# Patient Record
Sex: Male | Born: 1980 | Race: Black or African American | Hispanic: No | Marital: Married | Smoking: Never smoker
Health system: Southern US, Community
[De-identification: ages and names within clinical notes are randomized; demographics above are authoritative.]

## PROBLEM LIST (undated history)

## (undated) HISTORY — PX: PATELLAR TENDON REPAIR: SHX737

---

## 2018-09-18 ENCOUNTER — Emergency Department (HOSPITAL_COMMUNITY): Payer: BC Managed Care – PPO

## 2018-09-18 ENCOUNTER — Encounter (HOSPITAL_COMMUNITY): Payer: Self-pay

## 2018-09-18 ENCOUNTER — Emergency Department (HOSPITAL_COMMUNITY)
Admission: EM | Admit: 2018-09-18 | Discharge: 2018-09-18 | Disposition: A | Discharge status: Home / Self Care | Payer: BC Managed Care – PPO | Attending: Emergency Medicine | Admitting: Emergency Medicine

## 2018-09-18 DIAGNOSIS — X500XXD Overexertion from strenuous movement or load, subsequent encounter: Secondary | ICD-10-CM | POA: Diagnosis not present

## 2018-09-18 DIAGNOSIS — S86001D Unspecified injury of right Achilles tendon, subsequent encounter: Secondary | ICD-10-CM | POA: Insufficient documentation

## 2018-09-18 DIAGNOSIS — M25571 Pain in right ankle and joints of right foot: Secondary | ICD-10-CM | POA: Diagnosis present

## 2018-09-18 DIAGNOSIS — S86001A Unspecified injury of right Achilles tendon, initial encounter: Secondary | ICD-10-CM

## 2018-09-18 MED ORDER — NAPROXEN 500 MG PO TABS: 500.0000 mg | ORAL_TABLET | Freq: Two times a day (BID) | ORAL | 0 refills | Status: AC

## 2018-09-18 NOTE — ED Notes (Signed)
Ortho paged for cam walker.  

## 2018-09-18 NOTE — Progress Notes (Signed)
Orthopedic Tech Progress Note Patient Details:  Stephen Olson 1981-01-30 [id number] Pt was told by PA to take the boot home to put on because he drove himself here. I showed Pt how to put it on by putting it on him first. Ortho Devices Type of Ortho Device: CAM walker Ortho Device/Splint Location: Rt leg Ortho Device/Splint Interventions: Application   Post Interventions Patient Tolerated: Well Instructions Provided: Adjustment of device, Care of device   Tawni Carnes [facility] 09/18/2018, 11:43 AM

## 2018-09-18 NOTE — ED Provider Notes (Signed)
[facility] EMERGENCY DEPARTMENT Provider Note   CSN: [id number] Arrival date & time: 09/18/18  0901     History   Chief Complaint Chief Complaint  Patient presents with  . Ankle Pain    HPI Grier Souder is a 38 y.o. male.  HPI  Patient is a 38 year old male with no significant past medical history presenting for right ankle pain.  Patient reports that he experienced an ankle injury on September 02, 2018 while playing basketball.  He reports that he felt immediate pain of his right heel.  He reports he did not.  A clear pop, but it has been swollen since.  He denies any swelling of higher in his calf.  Patient ports he presented to emergency department at the [facility] in [place] and then subsequently in urgent care where he had an x-ray, which was negative for fracture, however he reports he still unable to fully walk on it.  Denies any loss of sensation in his foot.  History reviewed. No pertinent past medical history.  There are no active problems to display for this patient.   History reviewed. No pertinent surgical history.      Home Medications    Prior to Admission medications   Not on File    Family History No family history on file.  Social History Social History   Tobacco Use  . Smoking status: Not on file  Substance Use Topics  . Alcohol use: Not on file  . Drug use: Not on file     Allergies   Patient has no known allergies.   Review of Systems Review of Systems  Musculoskeletal: Positive for myalgias.  Skin: Negative for wound.  Neurological: Negative for weakness and numbness.     Physical Exam Updated Vital Signs BP 140/84 (BP Location: Right Arm)   Pulse 77   Temp 98.5 F (36.9 C) (Oral)   Resp 16   SpO2 97%   Physical Exam Vitals signs and nursing note reviewed.  Constitutional:      General: He is not in acute distress.    Appearance: He is well-developed. He is not diaphoretic.     Comments: Sitting  comfortably in bed.  HENT:     Head: Normocephalic and atraumatic.  Eyes:     General:        Right eye: No discharge.        Left eye: No discharge.     Conjunctiva/sclera: Conjunctivae normal.     Comments: EOMs normal to gross examination.  Neck:     Musculoskeletal: Normal range of motion.  Cardiovascular:     Rate and Rhythm: Normal rate and regular rhythm.     Comments: Intact, 2+ radial pulse. Abdominal:     General: There is no distension.  Musculoskeletal:     Comments: Right ankle with tenderness to palpation over lower Achilles tendon. Minor swelling around ankle.  Decreased range of motion with flexion and extension. No erythema, ecchymosis, or deformity appreciated. No break in skin. No pain in calf.  No pain to fifth metatarsal area or navicular region. Achilles partially intact but Thompson's test decreased compared to contralateral side. Good pedal pulse and cap refill of toes. Sensation intact to light touch distally.   Skin:    General: Skin is warm and dry.  Neurological:     Mental Status: He is alert.     Comments: Cranial nerves intact to gross observation. Patient moves extremities without difficulty.  Psychiatric:  Behavior: Behavior normal.        Thought Content: Thought content normal.        Judgment: Judgment normal.      ED Treatments / Results  Labs (all labs ordered are listed, but only abnormal results are displayed) Labs Reviewed - No data to display  EKG None  Radiology Dg Ankle Complete Right  Result Date: 09/18/2018 CLINICAL DATA:  38 year old male with persistent right ankle pain and swelling. Patient injured his ankle on 09/02/2018 while playing basketball. EXAM: RIGHT ANKLE - COMPLETE 3+ VIEW COMPARISON:  None. FINDINGS: No evidence of acute fracture, malalignment or ankle joint effusion. There is a thickening of the Achilles tendon stripe on the lateral view with associated infiltration of Kager's fat pad as well as diffuse  mild soft tissue swelling about the ankle. IMPRESSION: Abnormal thickening and haziness of the Achilles tendon outline on the lateral view with associated stranding in Kager's fat pad. Findings raise concern for Achilles tendon injury such as partial tear. No evidence of acute fracture or malalignment. Electronically Signed   By: Malachy MoanHeath  McCullough M.D.   On: 09/18/2018 10:45    Procedures Procedures (including critical care time)  Medications Ordered in ED Medications - No data to display   Initial Impression / Assessment and Plan / ED Course  I have reviewed the triage vital signs and the nursing notes.  Pertinent labs & imaging results that were available during my care of the patient were reviewed by me and considered in my medical decision making (see chart for details).     Patient is well-appearing and in no acute distress.  Neurovascularly intact in the right lower extremity.  Patient's Achilles tendon only feels partially intact and is very swollen in the lower Achilles tendon.  No calf tenderness, or erythema to suggest DVT or cellulitis.  X-ray demonstrates lateral soft tissue swelling  of Keger's fat pad concerning for partial Achilles tendon tear.  Discussed case with attending physician, Dr. Tilden FossaElizabeth Rees, and will proceed with cam walker boot and nonweightbearing status.  [facility].  I stated with patient that he should have orthopedic follow-up as soon as possible.  Return precautions given for increasing pain, pallor or paresthesias.  Patient is in understanding and agrees with the plan of care.  Final Clinical Impressions(s) / ED Diagnoses   Final diagnoses:  Injury of right Achilles tendon, initial encounter    ED Discharge Orders         Ordered    naproxen (NAPROSYN) 500 MG tablet  2 times daily     09/18/18 1123           Delia ChimesMurray, Nehemiah Mcfarren B, PA-C 09/18/18 1127    Tilden Fossaees, Elizabeth,  MD 09/20/18 [phone]

## 2018-09-18 NOTE — Discharge Instructions (Addendum)
Please see the information and instructions below regarding your visit.  Your diagnoses today include:  1. Injury of right Achilles tendon, initial encounter    Tests performed today include: See side panel of your discharge paperwork for testing performed today. Vital signs are listed at the bottom of these instructions.   Your ankle x-ray shows swelling in a pattern suggestive of possible Achilles tendon injury.  Medications prescribed:    Take any prescribed medications only as prescribed, and any over the counter medications only as directed on the packaging.  You are prescribed naproxen, a non-steroidal anti-inflammatory agent (NSAID) for pain. You may take 500 mg every 12 hours as needed for pain. If still requiring this medication around the clock for acute pain after 10 days, please see your primary healthcare provider.  Women who are pregnant, breastfeeding, or planning on becoming pregnant should not take non-steroidal anti-inflammatories such as Advil and Aleve. Tylenol is a safe over the counter pain reliever in pregnant women.  You may combine this medication with Tylenol, 650 mg every 6 hours, so you are receiving something for pain every 3 hours.  This is not a long-term medication unless under the care and direction of your primary provider. Taking this medication long-term and not under the supervision of a healthcare provider could increase the risk of stomach ulcers, kidney problems, and cardiovascular problems such as high blood pressure.   Home care instructions:  Please follow any educational materials contained in this packet.   Please ice and elevate the Achilles tendon.  Please place a towel in between your skin and the ice and ice for no more than 20 minutes at a time.  Please elevate your right foot whenever you are not up and about on your crutches.  Please remain nonweightbearing until you see the orthopedic physician at the [facility].  Follow-up  instructions: Please follow-up with your primary care provider as soon as possible to get an orthopedic follow up.   Return instructions:  Please return to the Emergency Department if you experience worsening symptoms.  Please return the emergency department if any increasing pain, numbness in your toes, discoloration of your toes.  Please first remove the cam walker boot and if there continues to be discoloration, please be evaluated immediately. Please return if you have any other emergent concerns.  Additional Information:   Your vital signs today were: BP 140/84 (BP Location: Right Arm)    Pulse 77    Temp 98.5 F (36.9 C) (Oral)    Resp 16    SpO2 97%  If your blood pressure (BP) was elevated on multiple readings during this visit above 130 for the top number or above 80 for the bottom number, please have this repeated by your primary care provider within one month. --------------  Thank you for allowing [place] to participate in your care today.

## 2018-09-18 NOTE — ED Triage Notes (Signed)
Pt presents for evaluation of ongoing R ankle swelling and difficulty ambulating since injury on 12/19. Pt was playing in basketball game when he heard a "snap" to R posterior ankle. Was evaluated at [facility] hospital with negative xray. Pt reports he was told to f/u if swelling or pain persisted.

## 2018-09-21 ENCOUNTER — Ambulatory Visit: Payer: Self-pay | Admitting: Family Medicine

## 2018-09-23 ENCOUNTER — Ambulatory Visit: Payer: Self-pay | Admitting: Family Medicine

## 2018-09-23 ENCOUNTER — Encounter: Payer: Self-pay | Admitting: Family Medicine

## 2018-09-23 VITALS — BP 130/84 | HR 95 | Ht 75.0 in | Wt 266.8 lb

## 2018-09-23 DIAGNOSIS — S86001A Unspecified injury of right Achilles tendon, initial encounter: Secondary | ICD-10-CM | POA: Insufficient documentation

## 2018-09-23 NOTE — Progress Notes (Signed)
Subjective:  Stephen Olson is a 38 y.o. male who presents today with a chief complaint of right ankle pain.   HPI:  Right Ankle Pain, new problem Patient was playing basketball on 09/02/18 when he felt a pop after landing to his right posterior ankle. He noticed immediate pain and swelling to the area. He went to urgent care and had an xray performed.  He continued to have weakness to the area and was seen at the emergency department 5 days ago.  There he had another x-ray performed which was concerning for possible Achilles tendon injury.  Patient was then placed in a cam walker boot.  Our last 5 days, pain has improved however still has significant difficulty with weakness to his area.  Also with some difficulty walking and standing due to the weakness.  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  He has not yet returned to school since suffering an injury.  No other obvious alleviating or aggravating factors.    ROS: Per HPI, otherwise a complete review of systems was negative.   PMH:  The following were reviewed and entered/updated in epic: No past medical history on file. Patient Active Problem List   Diagnosis Date Noted  . Injury of right Achilles tendon 09/23/2018   Past Surgical History:  Procedure Laterality Date  . PATELLAR TENDON REPAIR      No family history on file.  Medications- reviewed and updated Current Outpatient Medications  Medication Sig Dispense Refill  . naproxen (NAPROSYN) 500 MG tablet Take 1 tablet (500 mg total) by mouth 2 (two) times daily for 14 days. 28 tablet 0   No current facility-administered medications for this visit.     Allergies-reviewed and updated No Known Allergies  Social History   Socioeconomic History  . Marital status: Married    Spouse name: Not on file  . Number of children: Not on file  . Years of education: Not on file  . Highest education level: Not on file  Occupational History  . Not on file    Social Needs  . Financial resource strain: Not on file  . Food insecurity:    Worry: Not on file    Inability: Not on file  . Transportation needs:    Medical: Not on file    Non-medical: Not on file  Tobacco Use  . Smoking status: Never Smoker  . Smokeless tobacco: Never Used  Substance and Sexual Activity  . Alcohol use: Not on file  . Drug use: Not on file  . Sexual activity: Not on file  Lifestyle  . Physical activity:    Days per week: Not on file    Minutes per session: Not on file  . Stress: Not on file  Relationships  . Social connections:    Talks on phone: Not on file    Gets together: Not on file    Attends religious service: Not on file    Active member of club or organization: Not on file    Attends meetings of clubs or organizations: Not on file    Relationship status: Not on file  Other Topics Concern  . Not on file  Social History Narrative  . Not on file     Objective:  Physical Exam: BP 130/84 (BP Location: Left Arm, Patient Position: Sitting, Cuff Size: Large)   Pulse 95   Ht 6\' 3"  (1.905 m)   Wt 266 lb 12.8 oz (121 kg)  SpO2 96%   BMI 33.35 kg/m   Gen: NAD, resting comfortably CV: RRR with no murmurs appreciated Pulm: NWOB, CTAB with no crackles, wheezes, or rhonchi GI: Normal bowel sounds present. Soft, Nontender, Nondistended. MSK: No edema, cyanosis, or clubbing noted -R Ankle: No obvious deformities.  Tender to palpation along Achilles tendon especially near the insertion into calcaneus.  Very limited plantarflexion otherwise 5 out of 5 in all directions.  No plantarflexion with Thompson squeeze test.  Neurovascular intact distally. Skin: Warm, dry Neuro: Grossly normal, moves all extremities Psych: Normal affect and thought content  Assessment/Plan:  Injury of right Achilles tendon Concern for tear given his weakness on exam and Thompson test findings.  We will check MRI to further assess injury.  He will continue using cam boot and  continue nonweightbearing status.  He will continue using naproxen as needed for pain.  Depending on results of MRI, will likely need referral to orthopedics.  Katina Degree. Jimmey Ralph, MD 09/23/2018 2:40 PM

## 2018-09-23 NOTE — Patient Instructions (Signed)
It was very nice to see you today!  We need to get an MRI of the ankle to assess the extent of damage to your Achilles tendon.  You will be called to set this up.  In the meantime, please continue using the boot and continue with nonweightbearing.  You can use naproxen as needed as you have been doing.  Depending on the results, we may need to refer you to a orthopedic surgeon or physical therapist.  Take care, Dr Jimmey Ralph

## 2018-09-23 NOTE — Assessment & Plan Note (Signed)
Concern for tear given his weakness on exam and Thompson test findings.  We will check MRI to further assess injury.  He will continue using cam boot and continue nonweightbearing status.  He will continue using naproxen as needed for pain.  Depending on results of MRI, will likely need referral to orthopedics.

## 2018-09-24 ENCOUNTER — Ambulatory Visit: Payer: Self-pay | Admitting: Family Medicine

## 2018-10-05 ENCOUNTER — Other Ambulatory Visit: Payer: Self-pay

## 2018-10-06 ENCOUNTER — Inpatient Hospital Stay: Admission: RE | Admit: 2018-10-06 | Payer: Self-pay | Source: Ambulatory Visit

## 2018-10-07 ENCOUNTER — Telehealth: Payer: Self-pay | Admitting: Family Medicine

## 2018-10-07 NOTE — Telephone Encounter (Signed)
See note  Copied from CRM [id number]. Topic: Referral - Question >> Oct 07, 2018  3:11 PM Angela Nevin wrote: Reason for CRM: Patient just called to state that he will be having MRI done at [facility] hospital tomorrow so referral from 01/09 is unnecessary.

## 2018-10-07 NOTE — Telephone Encounter (Signed)
Noted.  I have canceled the order.

## 2018-10-26 ENCOUNTER — Telehealth: Payer: Self-pay | Admitting: Family Medicine

## 2018-10-26 DIAGNOSIS — Z0279 Encounter for issue of other medical certificate: Secondary | ICD-10-CM

## 2018-10-26 NOTE — Telephone Encounter (Signed)
Pt called back in to follow up on paperwork. Spoke with [facility], stated that she will have faxed by pt's deadline. She is awaiting signature from PCP.   Informed pt, he expressed understanding.

## 2018-10-26 NOTE — Telephone Encounter (Signed)
See note  Copied from CRM [id number]. Topic: General - Other >> Oct 26, 2018 12:01 PM Gaynelle AduPoole, Shalonda wrote: Reason for CRM:  Patient is  to state he turned in his FMLA- paperwork and MRI results, he stated his [place] [employer] [place] [employer] by 10-27-2018 at 5 pm. He would like a call back with a update. Please advise. The best contact number is [phone]

## 2018-10-26 NOTE — Telephone Encounter (Signed)
FMLA is in progress.  Patient has been notified.

## 2018-10-27 NOTE — Telephone Encounter (Signed)
Paper work has been faxed

## 2020-05-03 IMAGING — CR DG ANKLE COMPLETE 3+V*R*
3 series · 3 of 3 positions shown · non-contrast
Comparison: None.

CLINICAL DATA: 37-year-old male with persistent right ankle pain
and swelling. Patient injured his ankle on 09/02/2018 while playing
basketball.

EXAM:
RIGHT ANKLE - COMPLETE 3+ VIEW

[ankle ap]
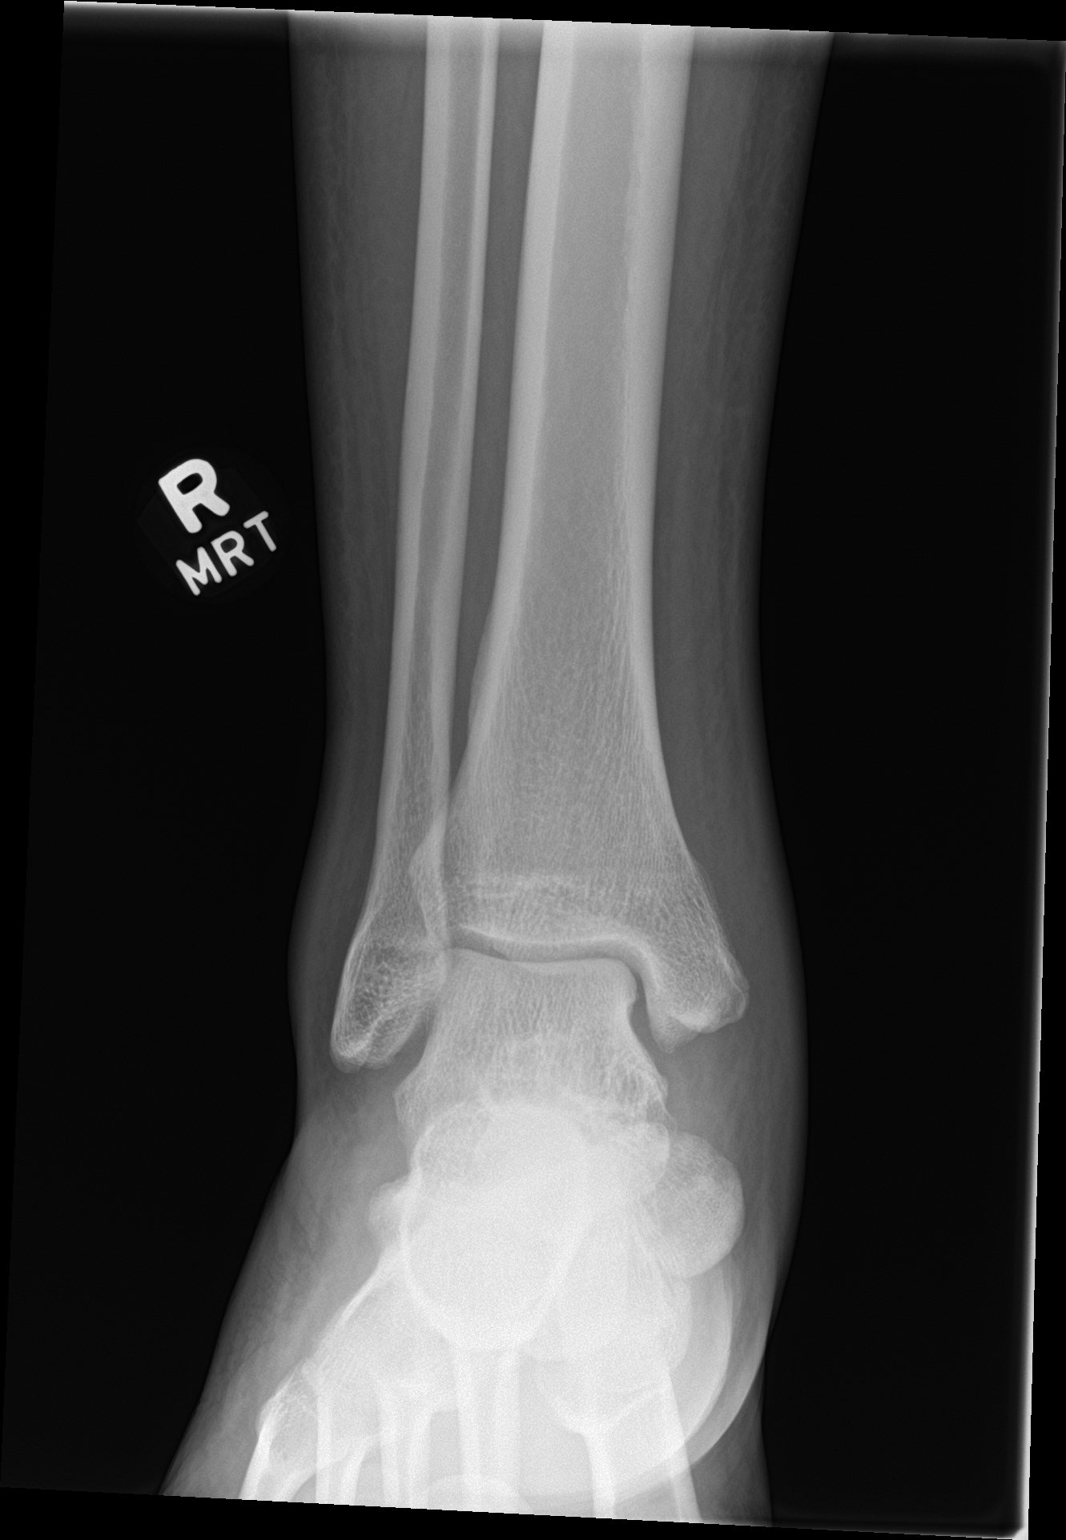

[ankle obl]
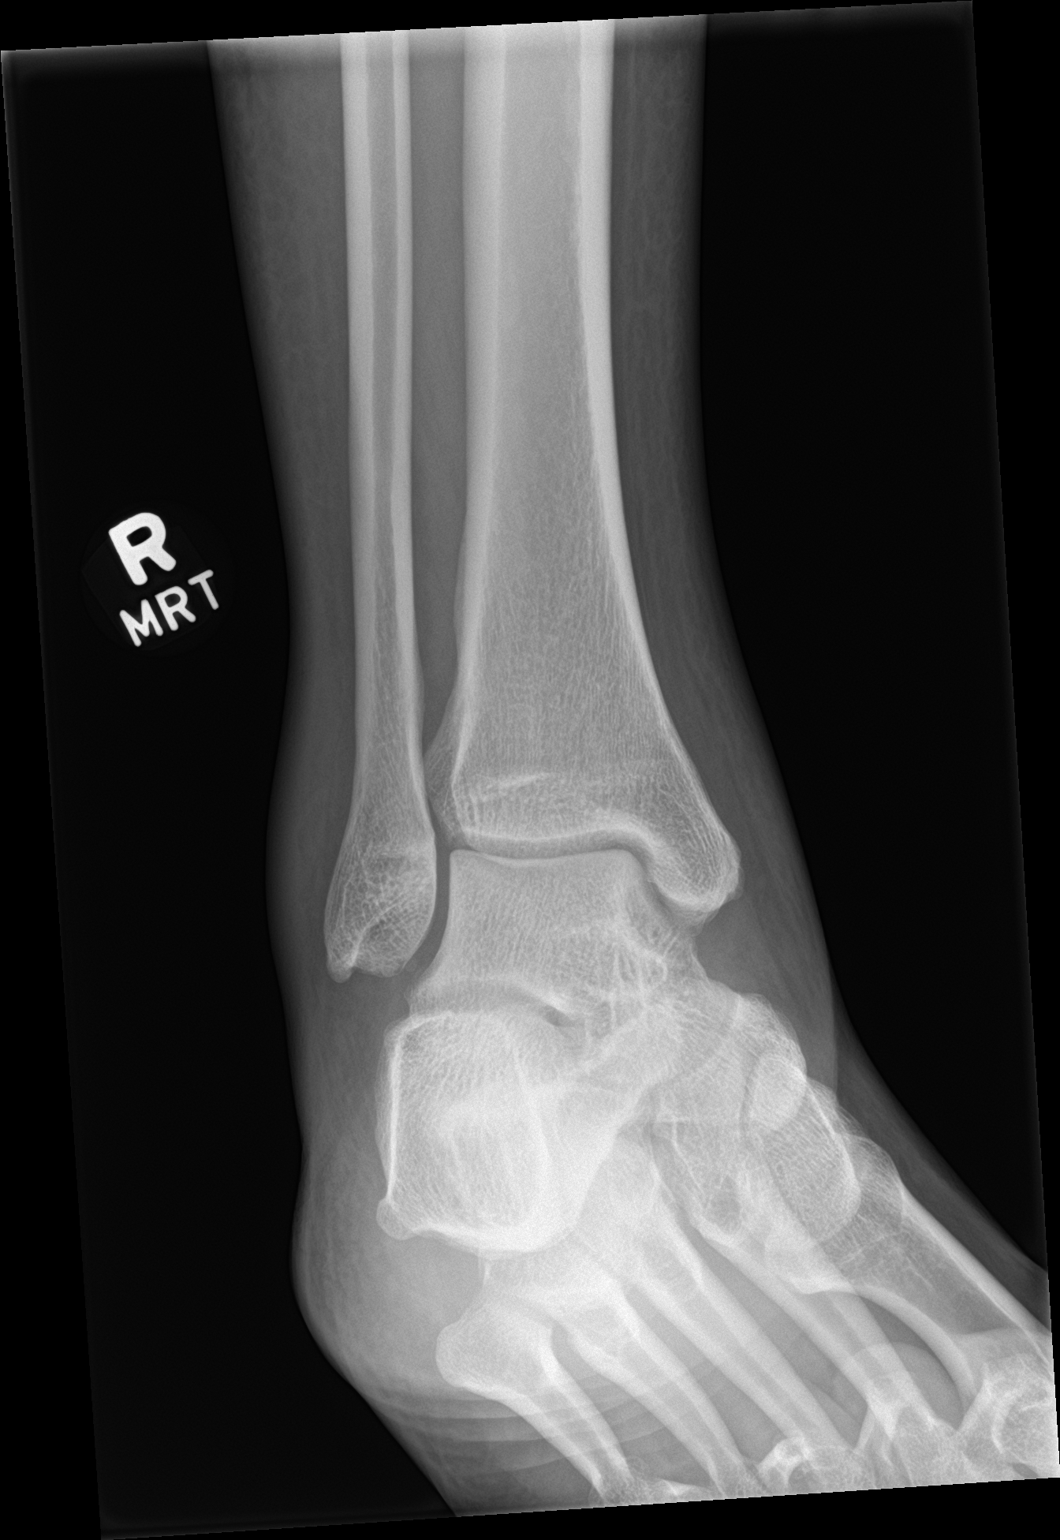

[ankle lat]
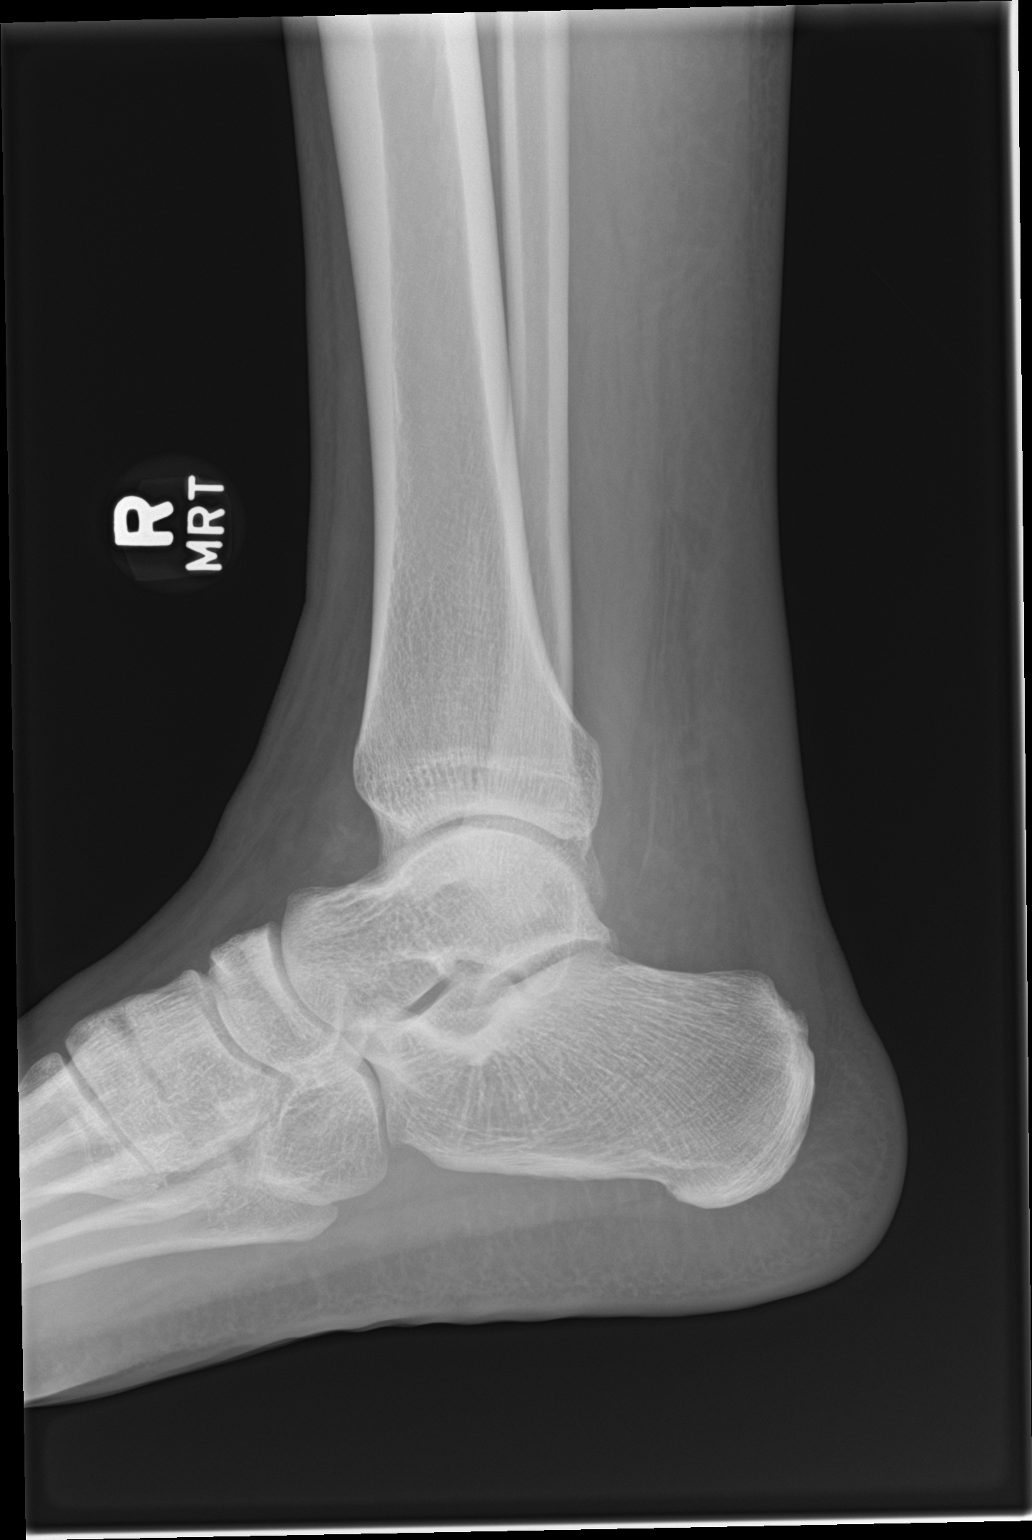

[3 of 3 positions shown; findings below may reference images not displayed]

FINDINGS: No evidence of acute fracture, malalignment or ankle joint effusion.
There is a thickening of the Achilles tendon stripe on the lateral
view with associated infiltration of Kager's fat pad as well as
diffuse mild soft tissue swelling about the ankle.
IMPRESSION: Abnormal thickening and haziness of the Achilles tendon outline on
the lateral view with associated stranding in Kager's fat pad.
Findings raise concern for Achilles tendon injury such as partial
tear.

No evidence of acute fracture or malalignment.

## 2022-05-08 ENCOUNTER — Encounter (INDEPENDENT_AMBULATORY_CARE_PROVIDER_SITE_OTHER): Payer: Self-pay

## 2022-05-08 ENCOUNTER — Encounter (INDEPENDENT_AMBULATORY_CARE_PROVIDER_SITE_OTHER): Payer: Self-pay | Admitting: Vascular Surgery

## 2022-05-28 ENCOUNTER — Other Ambulatory Visit (INDEPENDENT_AMBULATORY_CARE_PROVIDER_SITE_OTHER): Payer: Self-pay | Admitting: Nurse Practitioner

## 2022-05-28 DIAGNOSIS — I739 Peripheral vascular disease, unspecified: Secondary | ICD-10-CM

## 2022-05-28 DIAGNOSIS — R231 Pallor: Secondary | ICD-10-CM

## 2022-06-02 ENCOUNTER — Encounter (INDEPENDENT_AMBULATORY_CARE_PROVIDER_SITE_OTHER): Payer: Self-pay

## 2022-06-02 ENCOUNTER — Encounter (INDEPENDENT_AMBULATORY_CARE_PROVIDER_SITE_OTHER): Payer: Self-pay | Admitting: Vascular Surgery
